# Patient Record
Sex: Female | Born: 1935 | Hispanic: No | Marital: Single
Health system: Southern US, Community
[De-identification: ages and names within clinical notes are randomized; demographics above are authoritative.]

## PROBLEM LIST (undated history)

## (undated) DIAGNOSIS — J302 Other seasonal allergic rhinitis: Secondary | ICD-10-CM

## (undated) DIAGNOSIS — I10 Essential (primary) hypertension: Secondary | ICD-10-CM

## (undated) HISTORY — PX: PARTIAL HYSTERECTOMY: SHX80

---

## 1998-10-21 ENCOUNTER — Other Ambulatory Visit: Admission: RE | Admit: 1998-10-21 | Discharge: 1998-10-21 | Payer: Self-pay | Admitting: Internal Medicine

## 2001-05-19 ENCOUNTER — Other Ambulatory Visit: Admission: RE | Admit: 2001-05-19 | Discharge: 2001-05-19 | Payer: Self-pay | Admitting: Internal Medicine

## 2001-10-17 ENCOUNTER — Encounter: Admission: RE | Admit: 2001-10-17 | Discharge: 2001-10-17 | Payer: Self-pay | Admitting: Internal Medicine

## 2001-10-17 ENCOUNTER — Encounter: Payer: Self-pay | Admitting: Internal Medicine

## 2003-09-02 ENCOUNTER — Encounter: Admission: RE | Admit: 2003-09-02 | Discharge: 2003-09-02 | Payer: Self-pay | Admitting: Internal Medicine

## 2008-04-14 ENCOUNTER — Ambulatory Visit (HOSPITAL_BASED_OUTPATIENT_CLINIC_OR_DEPARTMENT_OTHER): Admission: RE | Admit: 2008-04-14 | Discharge: 2008-04-14 | Payer: Self-pay

## 2019-08-05 MED FILL — TRIMO-SAN JELLY: 0.025-0.01 | 30 days supply | Qty: 113 | Fill #0

## 2019-08-13 ENCOUNTER — Other Ambulatory Visit: Payer: Self-pay | Admitting: Obstetrics and Gynecology

## 2019-08-13 DIAGNOSIS — Z1231 Encounter for screening mammogram for malignant neoplasm of breast: Secondary | ICD-10-CM

## 2019-09-15 ENCOUNTER — Encounter: Payer: Self-pay | Admitting: Radiology

## 2019-09-15 ENCOUNTER — Ambulatory Visit
Admission: RE | Admit: 2019-09-15 | Discharge: 2019-09-15 | Disposition: A | Discharge status: Home / Self Care | Payer: Medicare Other | Source: Ambulatory Visit | Attending: Obstetrics and Gynecology | Admitting: Obstetrics and Gynecology

## 2019-09-15 DIAGNOSIS — Z1231 Encounter for screening mammogram for malignant neoplasm of breast: Secondary | ICD-10-CM | POA: Diagnosis not present

## 2021-05-11 IMAGING — MG DIGITAL SCREENING BILAT W/ TOMO W/ CAD
6 of 12 series · 6 of 36 positions shown · non-contrast
Comparison: Previous exam(s).

CLINICAL DATA: Screening.

EXAM:
DIGITAL SCREENING BILATERAL MAMMOGRAM WITH TOMO AND CAD

[L MLO synth-2D (1 of 2)]
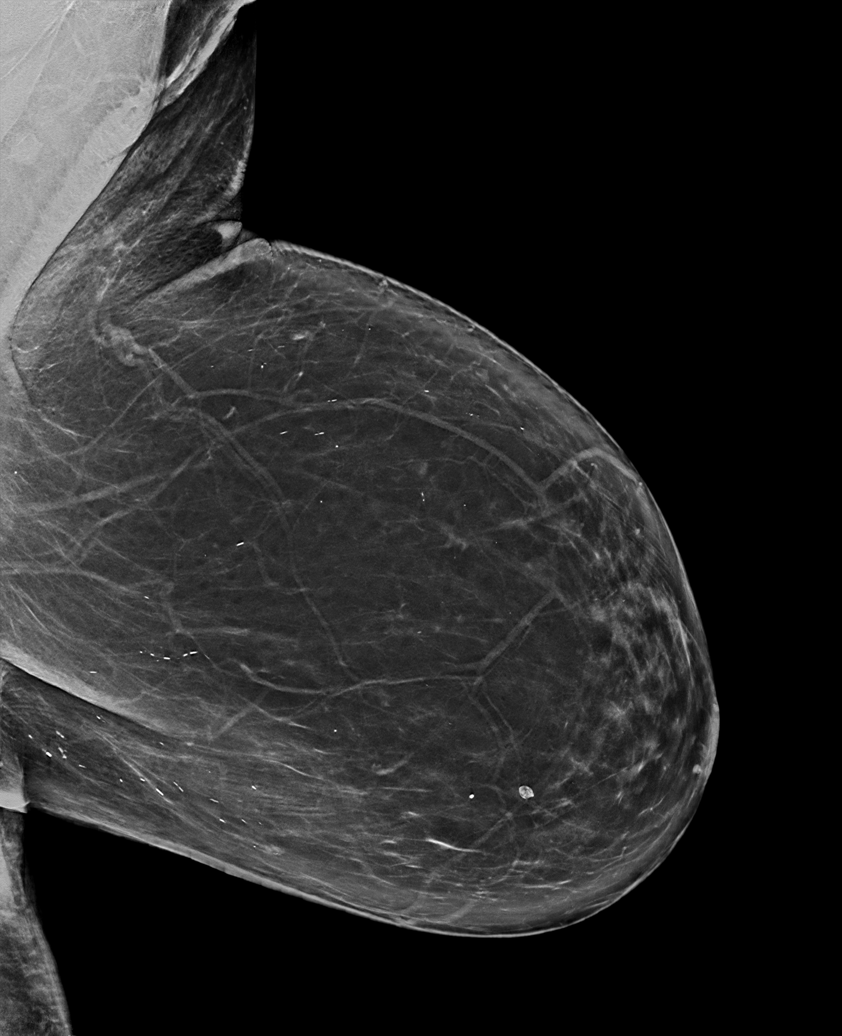

[L MLO synth-2D (2 of 2)]
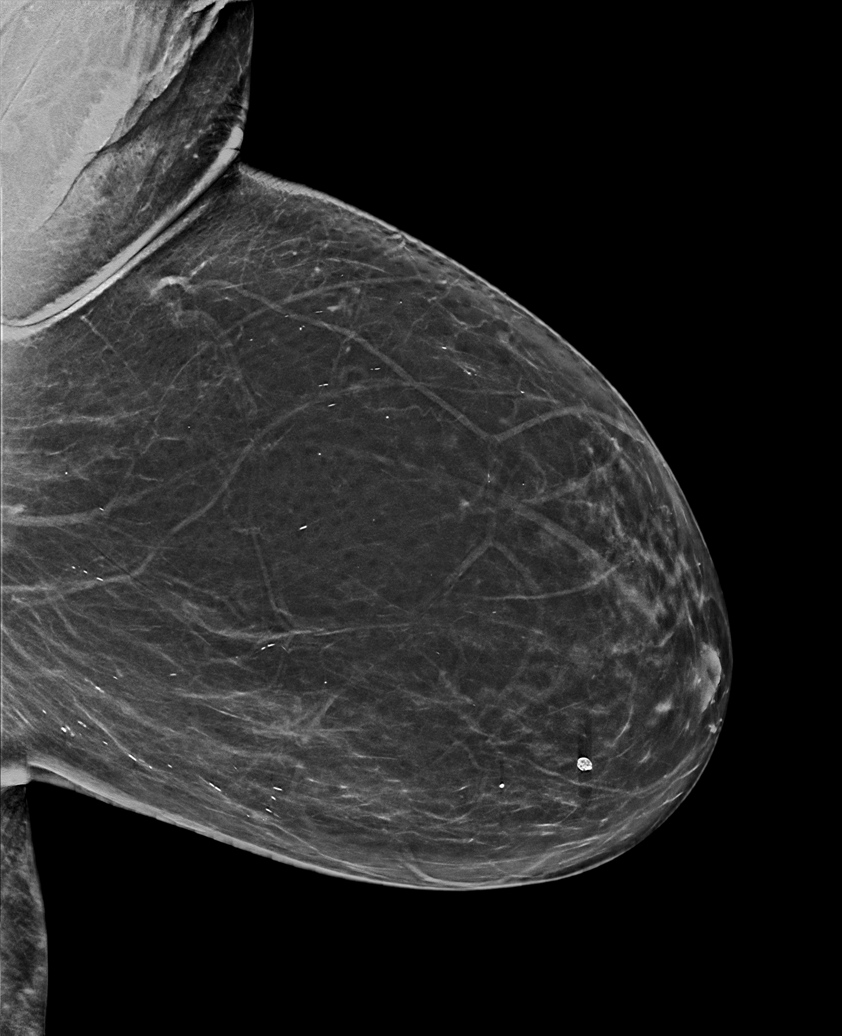

[R CC synth-2D]
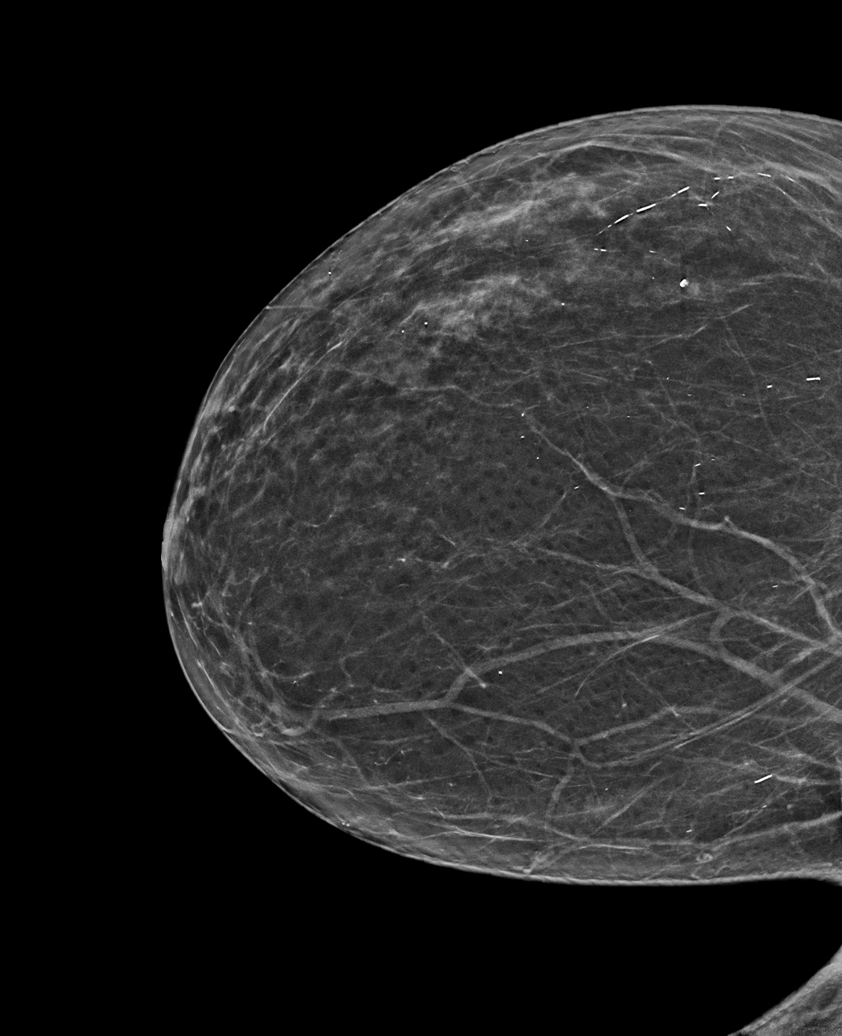

[R MLO synth-2D (1 of 2)]
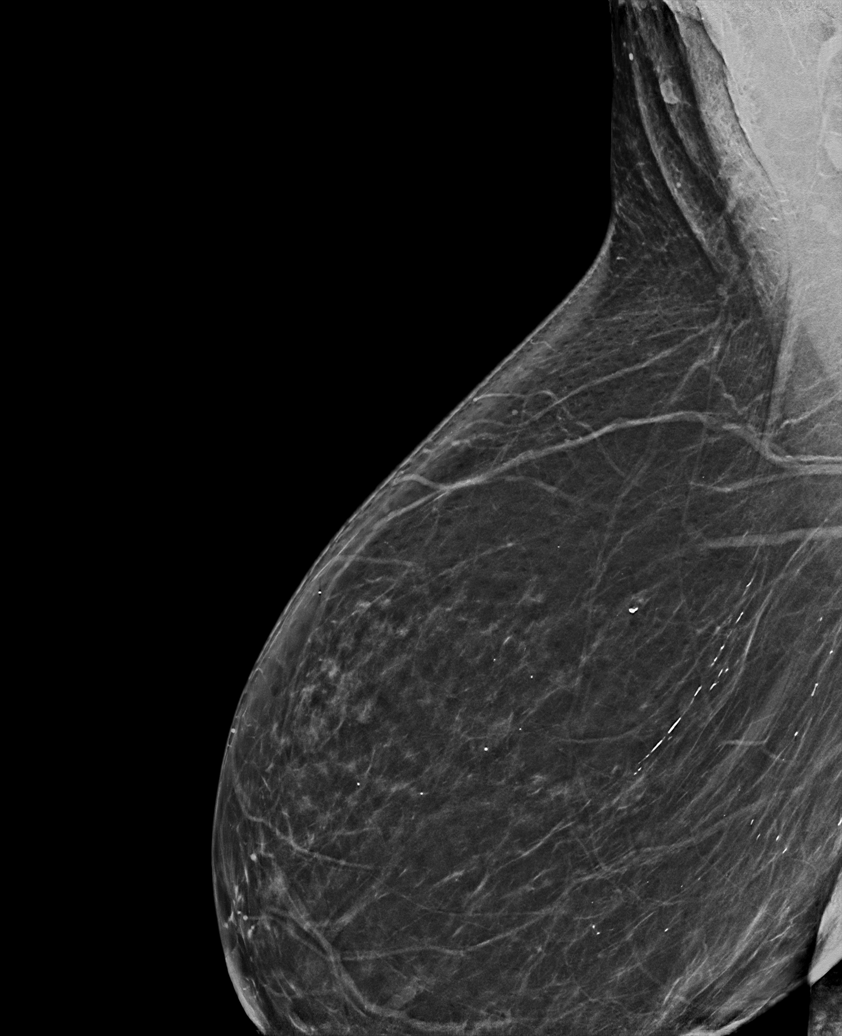

[L CC synth-2D]
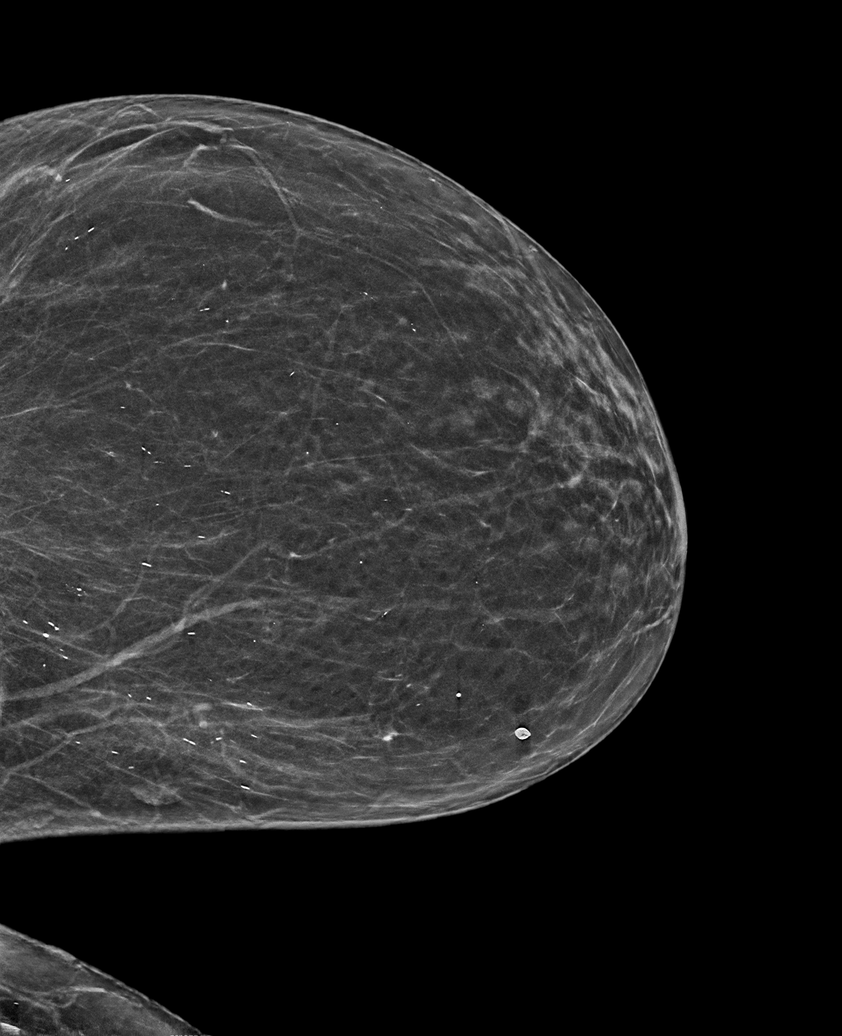

[R MLO synth-2D (2 of 2)]
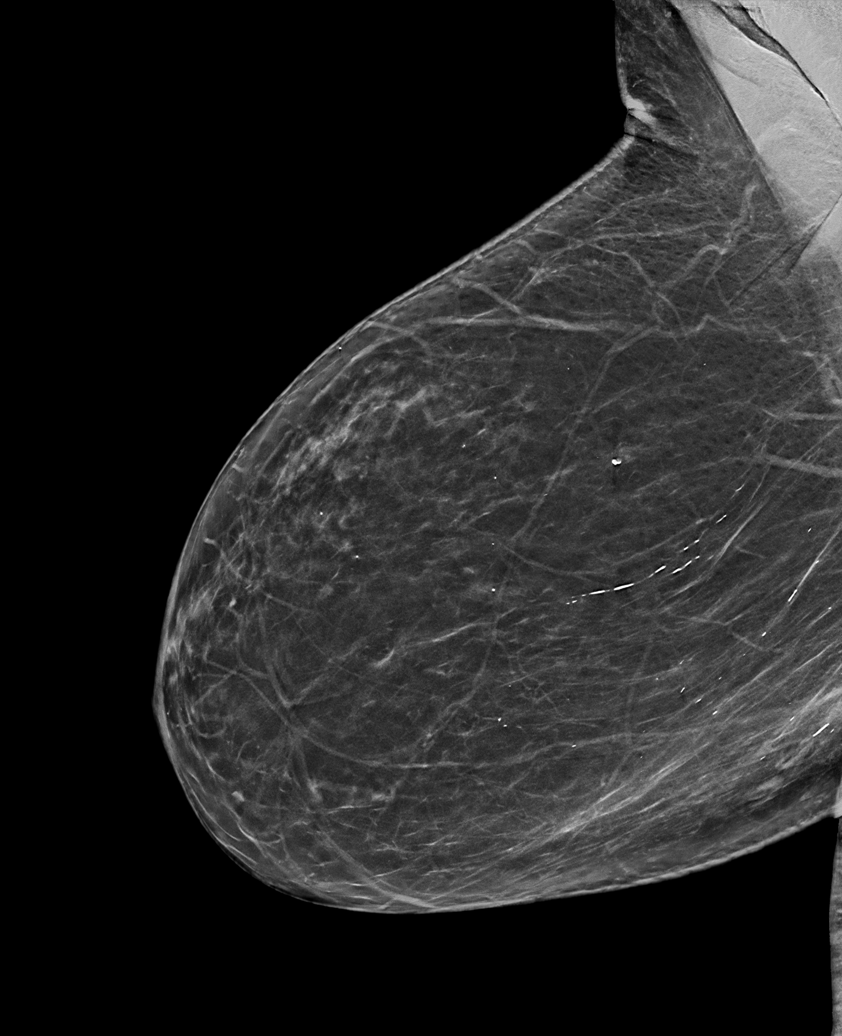

[6 of 36 positions shown; findings below may reference images not displayed]

ACR Breast Density Category b: There are scattered areas of
fibroglandular density.
FINDINGS: There are no findings suspicious for malignancy. Images were
processed with CAD.
IMPRESSION: No mammographic evidence of malignancy. A result letter of this
screening mammogram will be mailed directly to the patient.

RECOMMENDATION:
Screening mammogram in one year. (Code:[id number])

BI-RADS CATEGORY  1: Negative.

## 2022-09-26 ENCOUNTER — Ambulatory Visit
Admission: EM | Admit: 2022-09-26 | Discharge: 2022-09-26 | Disposition: A | Discharge status: Home / Self Care | Payer: Medicare Other

## 2022-09-26 DIAGNOSIS — R09A0 Foreign body sensation, unspecified: Secondary | ICD-10-CM

## 2022-09-26 HISTORY — DX: Other seasonal allergic rhinitis: J30.2

## 2022-09-26 HISTORY — DX: Essential (primary) hypertension: I10

## 2022-09-26 NOTE — ED Provider Notes (Signed)
Jo King    CSN: [id number] Arrival date & time: 09/26/22  1818      History   Chief Complaint Chief Complaint  Patient presents with   Foreign Body in Ear    HPI Jo King is a 87 y.o. female.  Accompanied by her daughter, patient presents with sensation of foreign body in her left ear canal.  She was using a Q-tip with some Vaseline in her ear and believes part of the Q-tip is in her ear canal.  No ear pain, ear drainage, fever, or other symptoms.  She is unable to wear her hearing aids because she is concerned for foreign body.  She is hard of hearing.  Her medical history also includes hypertension.  The history is provided by the patient, a relative and medical records.    Past Medical History:  Diagnosis Date   Hypertension    Seasonal allergies     There are no problems to display for this patient.   Past Surgical History:  Procedure Laterality Date   PARTIAL HYSTERECTOMY      OB History   No obstetric history on file.      Home Medications    Prior to Admission medications   Medication Sig Start Date End Date Taking? Authorizing Provider  amLODipine (NORVASC) 5 MG tablet Take 1 tablet by mouth daily. 06/01/19  Yes [provider]  fluticasone (FLONASE) 50 MCG/ACT nasal spray Place 2 sprays into both nostrils daily. 04/01/17  Yes [provider]  loratadine (CLARITIN) 10 MG tablet Take 10 mg by mouth daily.   Yes [provider]  lovastatin (MEVACOR) 20 MG tablet Take 1 tablet by mouth daily. 06/17/19  Yes [provider]  montelukast (SINGULAIR) 10 MG tablet Take 1 tablet by mouth every evening. 05/26/22  Yes [provider]  pantoprazole (PROTONIX) 40 MG tablet Take 1 tablet by mouth daily. 03/15/21  Yes [provider]  Travoprost, BAK Free, (TRAVATAN) 0.004 % SOLN ophthalmic solution PUT 1 DROP INTO BOTH EYES AT BEDTIME    [provider]    Family History No family  history on file.  Social History     Allergies   Patient has no known allergies.   Review of Systems Review of Systems  Constitutional:  Negative for chills and fever.  HENT:  Negative for ear discharge, ear pain and sore throat.   Respiratory:  Negative for cough and shortness of breath.   Cardiovascular:  Negative for chest pain and palpitations.     Physical Exam Triage Vital Signs ED Triage Vitals  Enc Vitals Group     BP 09/26/22 1852 134/77     Pulse Rate 09/26/22 1846 (!) 110     Resp 09/26/22 1842 18     Temp 09/26/22 1842 98.9 F (37.2 C)     Temp src --      SpO2 09/26/22 1842 93 %     Weight --      Height --      Head Circumference --      Peak Flow --      Pain Score 09/26/22 1846 0     Pain Loc --      Pain Edu? --      Excl. in GC? --    No data found.  Updated Vital Signs BP 134/77   Pulse (!) 110   Temp 98.9 F (37.2 C)   Resp 18   SpO2 93%   Visual  Acuity Right Eye Distance:   Left Eye Distance:   Bilateral Distance:    Right Eye Near:   Left Eye Near:    Bilateral Near:     Physical Exam Vitals and nursing note reviewed.  Constitutional:      General: She is not in acute distress.    Appearance: Normal appearance. She is well-developed. She is not ill-appearing.  HENT:     Right Ear: Tympanic membrane and ear canal normal.     Left Ear: Tympanic membrane and ear canal normal.     Mouth/Throat:     Mouth: Mucous membranes are moist.     Pharynx: Oropharynx is clear.  Cardiovascular:     Rate and Rhythm: Normal rate and regular rhythm.     Heart sounds: Normal heart sounds.  Pulmonary:     Effort: Pulmonary effort is normal. No respiratory distress.     Breath sounds: Normal breath sounds.  Musculoskeletal:     Cervical back: Neck supple.  Skin:    General: Skin is warm and dry.  Neurological:     Mental Status: She is alert.  Psychiatric:        Mood and Affect: Mood normal.        Behavior: Behavior normal.       UC Treatments / Results  Labs (all labs ordered are listed, but only abnormal results are displayed) Labs Reviewed - No data to display  EKG   Radiology No results found.  Procedures Procedures (including critical care time)  Medications Ordered in UC Medications - No data to display  Initial Impression / Assessment and Plan / UC Course  I have reviewed the triage vital signs and the nursing notes.  Pertinent labs & imaging results that were available during my care of the patient were reviewed by me and considered in my medical decision making (see chart for details).    Sensation of foreign body in left ear canal.  No foreign body in ear canal noted on exam.  TMs are clear.  Canals are clear.  Patient is well-appearing.  Instructed her to follow-up with her PCP if her symptoms are not improving.  Patient and her daughter agree to plan of care.     Final Clinical Impressions(s) / UC Diagnoses   Final diagnoses:  Sensation of foreign body   Discharge Instructions   None    ED Prescriptions   None    PDMP not reviewed this encounter.   Mickie Bail, NP 09/26/22 1932

## 2022-09-26 NOTE — ED Triage Notes (Addendum)
Patient to Urgent Care with daughter, complaints of foreign object present in left ear. Reports that she was using a q-tip with some Vaseline on and the end piece of cotton remained in her ear. States she feels as though it is at the top of her ear. Unable to remove herself. Incident occurred today.   Unable to wear hearing aids at this time. HOH.
